# Patient Record
Sex: Female | Born: 1975 | Race: Asian | Hispanic: No | Marital: Married | Smoking: Never smoker
Health system: Southern US, Community
[De-identification: ages and names within clinical notes are randomized; demographics above are authoritative.]

## PROBLEM LIST (undated history)

## (undated) DIAGNOSIS — R519 Headache, unspecified: Secondary | ICD-10-CM

## (undated) DIAGNOSIS — D259 Leiomyoma of uterus, unspecified: Secondary | ICD-10-CM

## (undated) DIAGNOSIS — R921 Mammographic calcification found on diagnostic imaging of breast: Secondary | ICD-10-CM

## (undated) DIAGNOSIS — R51 Headache: Secondary | ICD-10-CM

## (undated) DIAGNOSIS — H9201 Otalgia, right ear: Secondary | ICD-10-CM

## (undated) DIAGNOSIS — N809 Endometriosis, unspecified: Secondary | ICD-10-CM

## (undated) HISTORY — DX: Otalgia, right ear: H92.01

## (undated) HISTORY — PX: HERNIA REPAIR: SHX51

---

## 2011-08-19 HISTORY — PX: BREAST SURGERY: SHX581

## 2016-02-24 ENCOUNTER — Emergency Department
Admission: EM | Admit: 2016-02-24 | Discharge: 2016-02-24 | Disposition: A | Discharge status: Home / Self Care | Payer: BC Managed Care – PPO | Source: Home / Self Care | Attending: Family Medicine | Admitting: Family Medicine

## 2016-02-24 ENCOUNTER — Encounter: Payer: Self-pay | Admitting: Emergency Medicine

## 2016-02-24 DIAGNOSIS — J209 Acute bronchitis, unspecified: Secondary | ICD-10-CM | POA: Diagnosis not present

## 2016-02-24 MED ORDER — AZITHROMYCIN 250 MG PO TABS: ORAL_TABLET | ORAL | Status: DC

## 2016-02-24 MED ORDER — GUAIFENESIN-CODEINE 100-10 MG/5ML PO SOLN: ORAL | Status: DC

## 2016-02-24 NOTE — ED Notes (Signed)
Pt c/o cough x 4 days, some mucous but not a lot, feels like she's loosing her voice, unable to sleep @ night, no runny nose, no ear pain.

## 2016-02-24 NOTE — Discharge Instructions (Signed)
Take plain guaifenesin (1200mg extended release tabs such as Mucinex) twice daily, with plenty of water, for cough and congestion.  Get adequate rest.    °Stop all antihistamines for now, and other non-prescription cough/cold preparations. °  °Follow-up with family doctor if not improving about10 days.  °

## 2016-02-24 NOTE — ED Provider Notes (Signed)
CSN: [id number]     Arrival date & time 02/24/16  1135 History   First MD Initiated Contact with Patient 02/24/16 1214     Chief Complaint  Patient presents with  . Cough      HPI Comments: Patient complains of a four day history of non-productive cough without other cold-like symptoms such as sore throat and sinus congestion.  She has had no fevers, chills, and sweats.  She states that she often coughs until she gags.  The history is provided by the patient.    History reviewed. No pertinent past medical history. History reviewed. No pertinent past surgical history. No family history on file. Social History  Substance Use Topics  . Smoking status: Never Smoker   . Smokeless tobacco: None  . Alcohol Use: No   OB History    No data available     Review of Systems No sore throat + cough No pleuritic pain No wheezing No nasal congestion No post-nasal drainage No sinus pain/pressure No itchy/red eyes No earache No hemoptysis No SOB No fever/chills No nausea No vomiting No abdominal pain No diarrhea No urinary symptoms No skin rash + fatigue No myalgias + headache Used OTC meds without relief  Allergies  Aspirin  Home Medications   Prior to Admission medications   Medication Sig Start Date End Date Taking? Authorizing Provider  azithromycin (ZITHROMAX Z-PAK) 250 MG tablet Take 2 tabs today; then begin one tab once daily for 4 more days. 02/24/16   Kandra Nicolas, MD  guaiFENesin-codeine 100-10 MG/5ML syrup Take 5mL by mouth at bedtime as needed for cough 02/24/16   Kandra Nicolas, MD   Meds Ordered and Administered this Visit  Medications - No data to display  BP 96/61 mmHg  Pulse 67  Temp(Src) 98.4 F (36.9 C) (Oral)  Ht 5\' 7"  (1.702 m)  Wt 115 lb (52.164 kg)  BMI 18.01 kg/m2  SpO2 100%  LMP 02/05/2016 No data found.   Physical Exam Nursing notes and Vital Signs reviewed. Appearance:  Patient appears stated age, and in no acute distress Eyes:   Pupils are equal, round, and reactive to light and accomodation.  Extraocular movement is intact.  Conjunctivae are not inflamed  Ears:  Canals normal.  Tympanic membranes normal.  Nose:  Normal turbinates.  No sinus tenderness.   Pharynx:  Normal Neck:  Supple. No adenopathy  Lungs:  Clear to auscultation.  Breath sounds are equal.  Moving air well. Heart:  Regular rate and rhythm without murmurs, rubs, or gallops.  Abdomen:  Nontender without masses or hepatosplenomegaly.  Bowel sounds are present.  No CVA or flank tenderness.  Extremities:  No edema.  Skin:  No rash present.   ED Course  Procedures none  MDM   1. Acute bronchitis, unspecified organism    Suspect atypical organism.  Begin Z-pak. Rx for Robitussin AC for night time cough.   Take plain guaifenesin (1200mg  extended release tabs such as Mucinex) twice daily, with plenty of water, for cough and congestion.  Get adequate rest.   Stop all antihistamines for now, and other non-prescription cough/cold preparations.   Follow-up with family doctor if not improving about10 days.    Kandra Nicolas, MD 03/01/16 [phone]

## 2018-03-03 ENCOUNTER — Other Ambulatory Visit: Payer: Self-pay | Admitting: Obstetrics and Gynecology

## 2018-03-03 DIAGNOSIS — R921 Mammographic calcification found on diagnostic imaging of breast: Secondary | ICD-10-CM

## 2018-03-15 ENCOUNTER — Ambulatory Visit
Admission: RE | Admit: 2018-03-15 | Discharge: 2018-03-15 | Disposition: A | Discharge status: Home / Self Care | Payer: PRIVATE HEALTH INSURANCE | Source: Ambulatory Visit | Attending: Obstetrics and Gynecology | Admitting: Obstetrics and Gynecology

## 2018-03-15 DIAGNOSIS — R921 Mammographic calcification found on diagnostic imaging of breast: Secondary | ICD-10-CM

## 2018-03-23 ENCOUNTER — Other Ambulatory Visit: Payer: Self-pay | Admitting: General Surgery

## 2018-03-23 DIAGNOSIS — R921 Mammographic calcification found on diagnostic imaging of breast: Secondary | ICD-10-CM

## 2018-03-31 ENCOUNTER — Other Ambulatory Visit: Payer: Self-pay

## 2018-03-31 ENCOUNTER — Encounter (HOSPITAL_BASED_OUTPATIENT_CLINIC_OR_DEPARTMENT_OTHER): Payer: Self-pay | Admitting: *Deleted

## 2018-04-06 ENCOUNTER — Ambulatory Visit
Admission: RE | Admit: 2018-04-06 | Discharge: 2018-04-06 | Disposition: A | Discharge status: Home / Self Care | Payer: PRIVATE HEALTH INSURANCE | Source: Ambulatory Visit | Attending: General Surgery | Admitting: General Surgery

## 2018-04-06 DIAGNOSIS — R921 Mammographic calcification found on diagnostic imaging of breast: Secondary | ICD-10-CM

## 2018-04-08 ENCOUNTER — Ambulatory Visit (HOSPITAL_BASED_OUTPATIENT_CLINIC_OR_DEPARTMENT_OTHER)
Admission: RE | Admit: 2018-04-08 | Discharge: 2018-04-08 | Disposition: A | Discharge status: Home / Self Care | Payer: No Typology Code available for payment source | Source: Ambulatory Visit | Attending: General Surgery | Admitting: General Surgery

## 2018-04-08 ENCOUNTER — Ambulatory Visit (HOSPITAL_BASED_OUTPATIENT_CLINIC_OR_DEPARTMENT_OTHER): Payer: No Typology Code available for payment source | Admitting: Anesthesiology

## 2018-04-08 ENCOUNTER — Ambulatory Visit
Admission: RE | Admit: 2018-04-08 | Discharge: 2018-04-08 | Disposition: A | Discharge status: Home / Self Care | Payer: PRIVATE HEALTH INSURANCE | Source: Ambulatory Visit | Attending: General Surgery | Admitting: General Surgery

## 2018-04-08 ENCOUNTER — Encounter (HOSPITAL_BASED_OUTPATIENT_CLINIC_OR_DEPARTMENT_OTHER): Payer: Self-pay | Admitting: Anesthesiology

## 2018-04-08 ENCOUNTER — Other Ambulatory Visit: Payer: Self-pay

## 2018-04-08 ENCOUNTER — Encounter (HOSPITAL_BASED_OUTPATIENT_CLINIC_OR_DEPARTMENT_OTHER)
Admission: RE | Disposition: A | Discharge status: Home / Self Care | Payer: Self-pay | Source: Ambulatory Visit | Attending: General Surgery

## 2018-04-08 DIAGNOSIS — R920 Mammographic microcalcification found on diagnostic imaging of breast: Secondary | ICD-10-CM | POA: Insufficient documentation

## 2018-04-08 DIAGNOSIS — R921 Mammographic calcification found on diagnostic imaging of breast: Secondary | ICD-10-CM

## 2018-04-08 DIAGNOSIS — N6092 Unspecified benign mammary dysplasia of left breast: Secondary | ICD-10-CM | POA: Insufficient documentation

## 2018-04-08 DIAGNOSIS — D242 Benign neoplasm of left breast: Secondary | ICD-10-CM | POA: Insufficient documentation

## 2018-04-08 DIAGNOSIS — N6012 Diffuse cystic mastopathy of left breast: Secondary | ICD-10-CM | POA: Diagnosis not present

## 2018-04-08 HISTORY — PX: RADIOACTIVE SEED GUIDED EXCISIONAL BREAST BIOPSY: SHX6490

## 2018-04-08 HISTORY — DX: Endometriosis, unspecified: N80.9

## 2018-04-08 HISTORY — DX: Headache: R51

## 2018-04-08 HISTORY — DX: Leiomyoma of uterus, unspecified: D25.9

## 2018-04-08 HISTORY — DX: Mammographic calcification found on diagnostic imaging of breast: R92.1

## 2018-04-08 HISTORY — DX: Headache, unspecified: R51.9

## 2018-04-08 SURGERY — RADIOACTIVE SEED GUIDED BREAST BIOPSY
Anesthesia: General | Site: Breast | Laterality: Left

## 2018-04-08 MED ORDER — ONDANSETRON HCL 4 MG/2ML IJ SOLN
INTRAMUSCULAR | Status: DC | PRN
  Administered 2018-04-08: 4 mg via INTRAVENOUS

## 2018-04-08 MED ORDER — LIDOCAINE 2% (20 MG/ML) 5 ML SYRINGE
INTRAMUSCULAR | Status: AC
  Filled 2018-04-08: qty 5

## 2018-04-08 MED ORDER — ONDANSETRON HCL 4 MG/2ML IJ SOLN
INTRAMUSCULAR | Status: AC
  Filled 2018-04-08: qty 2

## 2018-04-08 MED ORDER — MIDAZOLAM HCL 2 MG/2ML IJ SOLN: 1.0000 mg | INTRAMUSCULAR | Status: DC | PRN

## 2018-04-08 MED ORDER — ACETAMINOPHEN 500 MG PO TABS
1000.0000 mg | ORAL_TABLET | ORAL | Status: AC
  Administered 2018-04-08: 1000 mg via ORAL

## 2018-04-08 MED ORDER — MIDAZOLAM HCL 5 MG/5ML IJ SOLN
INTRAMUSCULAR | Status: DC | PRN
  Administered 2018-04-08: 2 mg via INTRAVENOUS

## 2018-04-08 MED ORDER — MIDAZOLAM HCL 2 MG/2ML IJ SOLN
INTRAMUSCULAR | Status: AC
  Filled 2018-04-08: qty 2

## 2018-04-08 MED ORDER — GLYCOPYRROLATE PF 0.2 MG/ML IJ SOSY
PREFILLED_SYRINGE | INTRAMUSCULAR | Status: AC
  Filled 2018-04-08: qty 1

## 2018-04-08 MED ORDER — LIDOCAINE HCL (CARDIAC) PF 100 MG/5ML IV SOSY
PREFILLED_SYRINGE | INTRAVENOUS | Status: DC | PRN
  Administered 2018-04-08: 30 mg via INTRAVENOUS

## 2018-04-08 MED ORDER — MEPERIDINE HCL 25 MG/ML IJ SOLN: 6.2500 mg | INTRAMUSCULAR | Status: DC | PRN

## 2018-04-08 MED ORDER — FENTANYL CITRATE (PF) 100 MCG/2ML IJ SOLN
INTRAMUSCULAR | Status: DC | PRN
  Administered 2018-04-08: 100 ug via INTRAVENOUS

## 2018-04-08 MED ORDER — FENTANYL CITRATE (PF) 100 MCG/2ML IJ SOLN
INTRAMUSCULAR | Status: AC
  Filled 2018-04-08: qty 2

## 2018-04-08 MED ORDER — DEXAMETHASONE SODIUM PHOSPHATE 10 MG/ML IJ SOLN
INTRAMUSCULAR | Status: AC
  Filled 2018-04-08: qty 1

## 2018-04-08 MED ORDER — HYDROMORPHONE HCL 1 MG/ML IJ SOLN: 0.2500 mg | INTRAMUSCULAR | Status: DC | PRN

## 2018-04-08 MED ORDER — CEFAZOLIN SODIUM-DEXTROSE 2-4 GM/100ML-% IV SOLN
2.0000 g | INTRAVENOUS | Status: AC
  Administered 2018-04-08: 2 g via INTRAVENOUS

## 2018-04-08 MED ORDER — METHYLENE BLUE 0.5 % INJ SOLN
INTRAVENOUS | Status: AC
  Filled 2018-04-08: qty 10

## 2018-04-08 MED ORDER — PROPOFOL 10 MG/ML IV BOLUS
INTRAVENOUS | Status: AC
  Filled 2018-04-08: qty 20

## 2018-04-08 MED ORDER — BUPIVACAINE HCL (PF) 0.25 % IJ SOLN
INTRAMUSCULAR | Status: DC | PRN
  Administered 2018-04-08: 6 mL

## 2018-04-08 MED ORDER — GABAPENTIN 100 MG PO CAPS
100.0000 mg | ORAL_CAPSULE | ORAL | Status: AC
  Administered 2018-04-08: 100 mg via ORAL

## 2018-04-08 MED ORDER — EPHEDRINE SULFATE 50 MG/ML IJ SOLN
INTRAMUSCULAR | Status: DC | PRN
  Administered 2018-04-08 (×3): 10 mg via INTRAVENOUS

## 2018-04-08 MED ORDER — CEFAZOLIN SODIUM-DEXTROSE 2-4 GM/100ML-% IV SOLN
INTRAVENOUS | Status: AC
  Filled 2018-04-08: qty 100

## 2018-04-08 MED ORDER — LACTATED RINGERS IV SOLN: INTRAVENOUS | Status: DC

## 2018-04-08 MED ORDER — OXYCODONE HCL 5 MG/5ML PO SOLN: 5.0000 mg | Freq: Once | ORAL | Status: AC | PRN

## 2018-04-08 MED ORDER — EPHEDRINE 5 MG/ML INJ
INTRAVENOUS | Status: AC
  Filled 2018-04-08: qty 10

## 2018-04-08 MED ORDER — DEXAMETHASONE SODIUM PHOSPHATE 4 MG/ML IJ SOLN
INTRAMUSCULAR | Status: DC | PRN
  Administered 2018-04-08: 10 mg via INTRAVENOUS

## 2018-04-08 MED ORDER — GABAPENTIN 100 MG PO CAPS
ORAL_CAPSULE | ORAL | Status: AC
  Filled 2018-04-08: qty 1

## 2018-04-08 MED ORDER — SODIUM CHLORIDE 0.9 % IJ SOLN
INTRAMUSCULAR | Status: AC
  Filled 2018-04-08: qty 10

## 2018-04-08 MED ORDER — BUPIVACAINE HCL (PF) 0.25 % IJ SOLN
INTRAMUSCULAR | Status: AC
  Filled 2018-04-08: qty 120

## 2018-04-08 MED ORDER — OXYCODONE HCL 5 MG PO TABS
5.0000 mg | ORAL_TABLET | Freq: Once | ORAL | Status: AC | PRN
  Administered 2018-04-08: 5 mg via ORAL

## 2018-04-08 MED ORDER — OXYCODONE HCL 5 MG PO TABS
ORAL_TABLET | ORAL | Status: AC
  Filled 2018-04-08: qty 1

## 2018-04-08 MED ORDER — PROMETHAZINE HCL 25 MG/ML IJ SOLN: 6.2500 mg | INTRAMUSCULAR | Status: DC | PRN

## 2018-04-08 MED ORDER — FENTANYL CITRATE (PF) 100 MCG/2ML IJ SOLN: 50.0000 ug | INTRAMUSCULAR | Status: DC | PRN

## 2018-04-08 MED ORDER — LACTATED RINGERS IV SOLN
INTRAVENOUS | Status: DC
  Administered 2018-04-08 (×2): via INTRAVENOUS

## 2018-04-08 MED ORDER — ACETAMINOPHEN 500 MG PO TABS
ORAL_TABLET | ORAL | Status: AC
  Filled 2018-04-08: qty 2

## 2018-04-08 MED ORDER — GLYCOPYRROLATE 0.2 MG/ML IJ SOLN
INTRAMUSCULAR | Status: DC | PRN
  Administered 2018-04-08 (×2): 0.1 mg via INTRAVENOUS

## 2018-04-08 MED ORDER — SCOPOLAMINE 1 MG/3DAYS TD PT72: 1.0000 | MEDICATED_PATCH | Freq: Once | TRANSDERMAL | Status: DC | PRN

## 2018-04-08 SURGICAL SUPPLY — 42 items
BINDER BREAST MEDIUM (GAUZE/BANDAGES/DRESSINGS) ×2 IMPLANT
BLADE SURG 15 STRL LF DISP TIS (BLADE) ×1 IMPLANT
BLADE SURG 15 STRL SS (BLADE) ×1
CHLORAPREP W/TINT 26ML (MISCELLANEOUS) ×2 IMPLANT
COVER BACK TABLE 60X90IN (DRAPES) ×2 IMPLANT
COVER MAYO STAND STRL (DRAPES) ×2 IMPLANT
COVER PROBE W GEL 5X96 (DRAPES) ×2 IMPLANT
DERMABOND ADVANCED (GAUZE/BANDAGES/DRESSINGS) ×1
DERMABOND ADVANCED .7 DNX12 (GAUZE/BANDAGES/DRESSINGS) ×1 IMPLANT
DEVICE DUBIN W/COMP PLATE 8390 (MISCELLANEOUS) ×2 IMPLANT
DRAPE LAPAROSCOPIC ABDOMINAL (DRAPES) ×2 IMPLANT
DRAPE UTILITY XL STRL (DRAPES) ×2 IMPLANT
ELECT COATED BLADE 2.86 ST (ELECTRODE) ×2 IMPLANT
ELECT REM PT RETURN 9FT ADLT (ELECTROSURGICAL) ×2
ELECTRODE REM PT RTRN 9FT ADLT (ELECTROSURGICAL) ×1 IMPLANT
GLOVE BIO SURGEON STRL SZ7 (GLOVE) ×4 IMPLANT
GLOVE BIOGEL PI IND STRL 7.0 (GLOVE) ×2 IMPLANT
GLOVE BIOGEL PI IND STRL 7.5 (GLOVE) ×1 IMPLANT
GLOVE BIOGEL PI INDICATOR 7.0 (GLOVE) ×2
GLOVE BIOGEL PI INDICATOR 7.5 (GLOVE) ×1
GLOVE ECLIPSE 6.5 STRL STRAW (GLOVE) ×2 IMPLANT
GOWN STRL REUS W/ TWL LRG LVL3 (GOWN DISPOSABLE) ×2 IMPLANT
GOWN STRL REUS W/TWL LRG LVL3 (GOWN DISPOSABLE) ×2
ILLUMINATOR WAVEGUIDE N/F (MISCELLANEOUS) ×2 IMPLANT
KIT MARKER MARGIN INK (KITS) ×2 IMPLANT
NEEDLE HYPO 25X1 1.5 SAFETY (NEEDLE) ×2 IMPLANT
NS IRRIG 1000ML POUR BTL (IV SOLUTION) ×2 IMPLANT
PACK BASIN DAY SURGERY FS (CUSTOM PROCEDURE TRAY) ×2 IMPLANT
PENCIL BUTTON HOLSTER BLD 10FT (ELECTRODE) ×2 IMPLANT
SLEEVE SCD COMPRESS KNEE MED (MISCELLANEOUS) ×2 IMPLANT
SPONGE LAP 4X18 RFD (DISPOSABLE) ×2 IMPLANT
STRIP CLOSURE SKIN 1/2X4 (GAUZE/BANDAGES/DRESSINGS) ×2 IMPLANT
SUT MON AB 5-0 PS2 18 (SUTURE) ×2 IMPLANT
SUT VIC AB 2-0 SH 27 (SUTURE) ×1
SUT VIC AB 2-0 SH 27XBRD (SUTURE) ×1 IMPLANT
SUT VIC AB 3-0 SH 27 (SUTURE) ×1
SUT VIC AB 3-0 SH 27X BRD (SUTURE) ×1 IMPLANT
SYR CONTROL 10ML LL (SYRINGE) ×2 IMPLANT
TOWEL GREEN STERILE FF (TOWEL DISPOSABLE) ×2 IMPLANT
TOWEL OR NON WOVEN STRL DISP B (DISPOSABLE) ×2 IMPLANT
TUBE CONNECTING 20X1/4 (TUBING) ×2 IMPLANT
YANKAUER SUCT BULB TIP NO VENT (SUCTIONS) ×2 IMPLANT

## 2018-04-08 NOTE — H&P (Signed)
  42 yof referred by Dr Theda Sers for left breast calcifications. she is healthy without personal history. this was first mm. no fh of breast or ovarian cancer. she has no mass or dc. she works for [employer] and is here with her husband today. she has d density breasts. she has in uiq of left breast 2.5 cm area of calcs that is not amenable to core biopsy. she was referred to discuss options.  she pronounces her name [place].  Past Surgical History Rolm Bookbinder, MD; 03/24/2018 8:01 AM) Breast Augmentation  Bilateral. Open Inguinal Hernia Surgery  Right.  Diagnostic Studies History Rolm Bookbinder, MD; 03/24/2018 8:01 AM) Colonoscopy  never Mammogram  within last year  Allergies Sabino Gasser; 03/23/2018 8:47 AM) Aspirin *ANALGESICS - NonNarcotic*  Allergies Reconciled   Medication History Sabino Gasser; 03/23/2018 8:47 AM) No Current Medications Medications Reconciled  Social History Rolm Bookbinder, MD; 03/24/2018 8:01 AM) No alcohol use  No caffeine use  No drug use  Tobacco use  Never smoker.  Family History Rolm Bookbinder, MD; 03/24/2018 8:01 AM) First Degree Relatives  No pertinent family history   Pregnancy / Birth History Rolm Bookbinder, MD; 03/24/2018 8:01 AM) Age at menarche  42 years. Gravida  2 Length (months) of breastfeeding  12-24 Maternal age  66-30 Para  2 Regular periods   Vitals Sabino Gasser; 03/23/2018 8:48 AM) 03/23/2018 8:47 AM Weight: 117.25 lb Height: 67in Body Surface Area: 1.61 m Body Mass Index: 18.36 kg/m  Temp.: 98.2F(Oral)  Pulse: 64 (Regular)  BP: 112/72 (Sitting, Left Arm, Standard)   Physical Exam Rolm Bookbinder MD; 03/24/2018 7:56 AM) General Mental Status-Alert. Head and Neck Trachea-midline. Thyroid Gland Characteristics - normal size and consistency. Chest and Lung Exam Chest and lung exam reveals -quiet, even and easy respiratory effort with no use of accessory  muscles. Breast Nipples-No Discharge. Breast Lump-No Palpable Breast Mass. Note: bilateral implants Cardiovascular Cardiovascular examination reveals -normal heart sounds, regular rate and rhythm with no murmurs. Neurologic Neurologic evaluation reveals -alert and oriented x 3 with no impairment of recent or remote memory. Lymphatic Head & Neck General Head & Neck Lymphatics: Bilateral - Description - Normal. Axillary General Axillary Region: Bilateral - Description - Normal. Note: no [place] adenopathy   Assessment & Plan Rolm Bookbinder MD; 03/24/2018 8:00 AM) CALCIFICATION OF LEFT BREAST ON MAMMOGRAPHY (R92.1) Story: Left breast seed guided excisional biopsy we discussed options of six month follow up vs surgery. discussed birads 4 mm. discussed pros and cons of both strategies. she would like to proceed with excisional biopsy and we discussed doing this with seed guidance. discussed surgery, risks and recovery, will proceed soon

## 2018-04-08 NOTE — Discharge Instructions (Signed)
NO TYLENOL before 2:30 pm!    [employer] Phone Number [phone]  POST OP INSTRUCTIONS Take ibuprofen 400 mg every 8 hours or tylenol 650 mg every 6 hours for next 3 days then as needed. Use ice 2-3 times daily for next 3 days also. Always review your discharge instruction sheet given to you by the facility where your surgery was performed.  IF YOU HAVE DISABILITY OR FAMILY LEAVE FORMS, YOU MUST BRING THEM TO THE OFFICE FOR PROCESSING.  DO NOT GIVE THEM TO YOUR DOCTOR.  1. A prescription for pain medication may be given to you upon discharge.  Take your pain medication as prescribed, if needed.  If narcotic pain medicine is not needed, then you may take acetaminophen (Tylenol), naprosyn (Alleve) or ibuprofen (Advil) as needed. 2. Take your usually prescribed medications unless otherwise directed 3. If you need a refill on your pain medication, please contact your pharmacy.  They will contact our office to request authorization.  Prescriptions will not be filled after 5pm or on week-ends. 4. You should eat very light the first 24 hours after surgery, such as soup, crackers, pudding, etc.  Resume your normal diet the day after surgery. 5. Most patients will experience some swelling and bruising in the breast.  Ice packs and a good support bra will help.  Wear the breast binder provided or a sports bra for 72 hours day and night.  After that wear a sports bra during the day until you return to the office. Swelling and bruising can take several days to resolve.  6. It is common to experience some constipation if taking pain medication after surgery.  Increasing fluid intake and taking a stool softener will usually help or prevent this problem from occurring.  A mild laxative (Milk of Magnesia or Miralax) should be taken according to package directions if there are no bowel movements after 48 hours. 7. Unless discharge instructions indicate otherwise, you may remove your  bandages 48 hours after surgery and you may shower at that time.  You may have steri-strips (small skin tapes) in place directly over the incision.  These strips should be left on the skin for 7-10 days and will come off on their own.  If your surgeon used skin glue on the incision, you may shower in 24 hours.  The glue will flake off over the next 2-3 weeks.  Any sutures or staples will be removed at the office during your follow-up visit. 8. ACTIVITIES:  You may resume regular daily activities (gradually increasing) beginning the next day.  Wearing a good support bra or sports bra minimizes pain and swelling.  You may have sexual intercourse when it is comfortable. a. You may drive when you no longer are taking prescription pain medication, you can comfortably wear a seatbelt, and you can safely maneuver your car and apply brakes. b. RETURN TO WORK:  ______________________________________________________________________________________ 9. You should see your doctor in the office for a follow-up appointment approximately two weeks after your surgery.  Your doctors nurse will typically make your follow-up appointment when she calls you with your pathology report.  Expect your pathology report 3-4 business days after your surgery.  You may call to check if you do not hear from [place] after three days. 10. OTHER INSTRUCTIONS: _______________________________________________________________________________________________ _____________________________________________________________________________________________________________________________________ _____________________________________________________________________________________________________________________________________ _____________________________________________________________________________________________________________________________________  WHEN TO CALL DR WAKEFIELD: 1. Fever over 101.0 2. Nausea and/or vomiting. 3. Extreme swelling  or bruising. 4. Continued bleeding from incision. 5. Increased pain, redness, or  drainage from the incision.  The clinic staff is available to answer your questions during regular business hours.  Please dont hesitate to call and ask to speak to one of the nurses for clinical concerns.  If you have a medical emergency, go to the nearest emergency room or call 911.  A surgeon from [facility] is always on call at the hospital.  For further questions, please visit [email or web address]     Post Anesthesia Home Care Instructions  Activity: Get plenty of rest for the remainder of the day. A responsible individual must stay with you for 24 hours following the procedure.  For the next 24 hours, DO NOT: -Drive a car -Paediatric nurse -Drink alcoholic beverages -Take any medication unless instructed by your physician -Make any legal decisions or sign important papers.  Meals: Start with liquid foods such as gelatin or soup. Progress to regular foods as tolerated. Avoid greasy, spicy, heavy foods. If nausea and/or vomiting occur, drink only clear liquids until the nausea and/or vomiting subsides. Call your physician if vomiting continues.  Special Instructions/Symptoms: Your throat may feel dry or sore from the anesthesia or the breathing tube placed in your throat during surgery. If this causes discomfort, gargle with warm salt water. The discomfort should disappear within 24 hours.  If you had a scopolamine patch placed behind your ear for the management of post- operative nausea and/or vomiting:  1. The medication in the patch is effective for 72 hours, after which it should be removed.  Wrap patch in a tissue and discard in the trash. Wash hands thoroughly with soap and water. 2. You may remove the patch earlier than 72 hours if you experience unpleasant side effects which may include dry mouth, dizziness or visual disturbances. 3. Avoid touching the patch. Wash  your hands with soap and water after contact with the patch.

## 2018-04-08 NOTE — Op Note (Signed)
Preoperative diagnosis: Left breast microcalcifications, unable to core biopsy due to breast size and presence of implants Postoperative diagnosis: Same as above Procedure: Left breast seed guided excisional biopsy Surgeon: Dr. Serita Grammes Anesthesia: General Specimens: Left breast tissue marked with paint Complications: None Drains: None Sponge needle count was correct at completion Disposition: To recovery in stable condition  Indications: This is a 42 year old healthy female who presents with left breast microcalcifications.  She is unable to undergo a core biopsy.  She was recommended for excision.  I discussed options with her and we have decided to proceed with a seed guided excisional biopsy  Procedure: After informed consent was obtained the patient was taken to the operating room.  She was placed under general anesthesia with comp without complication.  She had been given antibiotics.  SCDs were in place.  She was prepped and draped in the standard sterile surgical fashion.  A surgical timeout was then performed.  I located the seed in the upper outer quadrant.  I then infiltrated Marcaine throughout this area.  I made a periareolar incision in order to hide the scar.  I then proceeded to use the neoprobe to guide the excision of the seed and the surrounding tissue.  This was then marked with paint and passed off the table.  Mammography confirmed removal of the seed and multiple calcifications.  Hemostasis was then obtained.  I closed the breast tissue with 2-0 Vicryl.  The skin was closed with 3-0 Vicryl, 5-0 Monocryl, and glue.  She tolerated this well was extubated and transferred to recovery room stable.

## 2018-04-08 NOTE — Interval H&P Note (Signed)
History and Physical Interval Note:  04/08/2018 8:38 AM  Deborah Franklin  has presented today for surgery, with the diagnosis of LEFT BREAST CALCIFICATIONS  The various methods of treatment have been discussed with the patient and family. After consideration of risks, benefits and other options for treatment, the patient has consented to  Procedure(s): RADIOACTIVE SEED GUIDED EXCISIONAL BREAST BIOPSY (Left) as a surgical intervention .  The patient's history has been reviewed, patient examined, no change in status, stable for surgery.  I have reviewed the patient's chart and labs.  Questions were answered to the patient's satisfaction.     Rolm Bookbinder

## 2018-04-08 NOTE — Anesthesia Procedure Notes (Signed)
Procedure Name: LMA Insertion Date/Time: 04/08/2018 9:22 AM Performed by: Marrianne Mood, CRNA Pre-anesthesia Checklist: Patient identified, Emergency Drugs available, Suction available, Patient being monitored and Timeout performed Patient Re-evaluated:Patient Re-evaluated prior to induction Oxygen Delivery Method: Circle system utilized Preoxygenation: Pre-oxygenation with 100% oxygen Induction Type: IV induction Ventilation: Mask ventilation without difficulty LMA: LMA inserted LMA Size: 4.0 Number of attempts: 1 Airway Equipment and Method: Bite block Placement Confirmation: positive ETCO2 Tube secured with: Tape Dental Injury: Teeth and Oropharynx as per pre-operative assessment

## 2018-04-08 NOTE — Anesthesia Postprocedure Evaluation (Signed)
Anesthesia Post Note  Patient: Deborah Franklin  Procedure(s) Performed: RADIOACTIVE SEED GUIDED EXCISIONAL BREAST BIOPSY (Left Breast)     Patient location during evaluation: PACU Anesthesia Type: General Level of consciousness: awake and alert Pain management: pain level controlled Vital Signs Assessment: post-procedure vital signs reviewed and stable Respiratory status: spontaneous breathing, nonlabored ventilation, respiratory function stable and patient connected to nasal cannula oxygen Cardiovascular status: blood pressure returned to baseline and stable Postop Assessment: no apparent nausea or vomiting Anesthetic complications: no    Last Vitals:  Vitals:   04/08/18 1100 04/08/18 1115  BP: 135/90 131/87  Pulse: 71 62  Resp: 17 12  Temp:    SpO2: 100% 100%    Last Pain:  Vitals:   04/08/18 1115  TempSrc:   PainSc: Talmage Cederick Broadnax

## 2018-04-08 NOTE — Anesthesia Preprocedure Evaluation (Addendum)
Anesthesia Evaluation  Patient identified by MRN, date of birth, ID band Patient awake    Reviewed: Allergy & Precautions, NPO status , Patient's Chart, lab work & pertinent test results  Airway Mallampati: II  TM Distance: >3 FB Neck ROM: Full    Dental  (+) Teeth Intact, Dental Advisory Given   Pulmonary neg pulmonary ROS,    breath sounds clear to auscultation       Cardiovascular negative cardio ROS   Rhythm:Regular Rate:Normal     Neuro/Psych  Headaches, negative psych ROS   GI/Hepatic negative GI ROS, Neg liver ROS,   Endo/Other  negative endocrine ROS  Renal/GU negative Renal ROS     Musculoskeletal negative musculoskeletal ROS (+)   Abdominal   Peds  Hematology negative hematology ROS (+)   Anesthesia Other Findings   Reproductive/Obstetrics                            Anesthesia Physical Anesthesia Plan  ASA: II  Anesthesia Plan: General   Post-op Pain Management:    Induction: Intravenous  PONV Risk Score and Plan: 4 or greater and Ondansetron, Dexamethasone, Midazolam and Scopolamine patch - Pre-op  Airway Management Planned: LMA  Additional Equipment: None  Intra-op Plan:   Post-operative Plan: Extubation in OR  Informed Consent: I have reviewed the patients History and Physical, chart, labs and discussed the procedure including the risks, benefits and alternatives for the proposed anesthesia with the patient or authorized representative who has indicated his/her understanding and acceptance.   Dental advisory given  Plan Discussed with: CRNA  Anesthesia Plan Comments:        Anesthesia Quick Evaluation

## 2018-04-08 NOTE — Transfer of Care (Signed)
Immediate Anesthesia Transfer of Care Note  Patient: Deborah Franklin  Procedure(s) Performed: RADIOACTIVE SEED GUIDED EXCISIONAL BREAST BIOPSY (Left Breast)  Patient Location: PACU  Anesthesia Type:General  Level of Consciousness: awake and patient cooperative  Airway & Oxygen Therapy: Patient Spontanous Breathing and Patient connected to face mask oxygen  Post-op Assessment: Report given to RN and Post -op Vital signs reviewed and stable  Post vital signs: Reviewed and stable  Last Vitals:  Vitals Value Taken Time  BP    Temp    Pulse 72 04/08/2018 10:06 AM  Resp    SpO2 100 % 04/08/2018 10:06 AM  Vitals shown include unvalidated device data.  Last Pain:  Vitals:   04/08/18 0833  TempSrc: Oral  PainSc: 1       Patients Stated Pain Goal: 1 (37/44/51 4604)  Complications: No apparent anesthesia complications

## 2018-04-09 ENCOUNTER — Encounter (HOSPITAL_BASED_OUTPATIENT_CLINIC_OR_DEPARTMENT_OTHER): Payer: Self-pay | Admitting: General Surgery

## 2019-10-20 ENCOUNTER — Ambulatory Visit: Payer: No Typology Code available for payment source | Attending: Family

## 2019-10-20 DIAGNOSIS — Z23 Encounter for immunization: Secondary | ICD-10-CM | POA: Insufficient documentation

## 2019-10-20 NOTE — Progress Notes (Signed)
   Covid-19 Vaccination Clinic  Name:  Deborah Franklin    MRN: [id number] DOB: 07-07-1976  10/20/2019  Deborah Franklin was observed post Covid-19 immunization for 15 minutes without incident. She was provided with Vaccine Information Sheet and instruction to access the V-Safe system.   Deborah Franklin was instructed to call 911 with any severe reactions post vaccine: Marland Kitchen Difficulty breathing  . Swelling of face and throat  . A fast heartbeat  . A bad rash all over body  . Dizziness and weakness   Immunizations Administered    Name Date Dose VIS Date Route   Moderna COVID-19 Vaccine 10/20/2019  2:26 PM 0.5 mL 07/19/2019 Intramuscular   Manufacturer: Moderna   Lot: [id number]   [id number]

## 2019-11-22 ENCOUNTER — Ambulatory Visit: Payer: No Typology Code available for payment source | Attending: Internal Medicine

## 2019-11-22 DIAGNOSIS — Z23 Encounter for immunization: Secondary | ICD-10-CM

## 2019-11-22 NOTE — Progress Notes (Signed)
   Covid-19 Vaccination Clinic  Name:  Deborah Franklin    MRN: [id number] DOB: 12-24-1975  11/22/2019  [name] was observed post Covid-19 immunization for 15 minutes without incident. She was provided with Vaccine Information Sheet and instruction to access the V-Safe system.   [name] was instructed to call 911 with any severe reactions post vaccine: Marland Kitchen Difficulty breathing  . Swelling of face and throat  . A fast heartbeat  . A bad rash all over body  . Dizziness and weakness   Immunizations Administered    Name Date Dose VIS Date Route   Moderna COVID-19 Vaccine 11/22/2019  1:02 PM 0.5 mL 07/19/2019 Intramuscular   Manufacturer: Moderna   Lot: [id number]   [id number]

## 2020-03-18 IMAGING — MG BREAST SURGICAL SPECIMEN
1 series · 1 of 1 positions shown · non-contrast
Comparison: Previous exam(s).

CLINICAL DATA: Radioactive seed localization was performed on
April 06, 2018 of a region of calcifications in the upper inner
left breast prior to excisional biopsy. The patient had her
excisional biopsy today.

EXAM:
SPECIMEN RADIOGRAPH OF THE LEFT BREAST

[L]
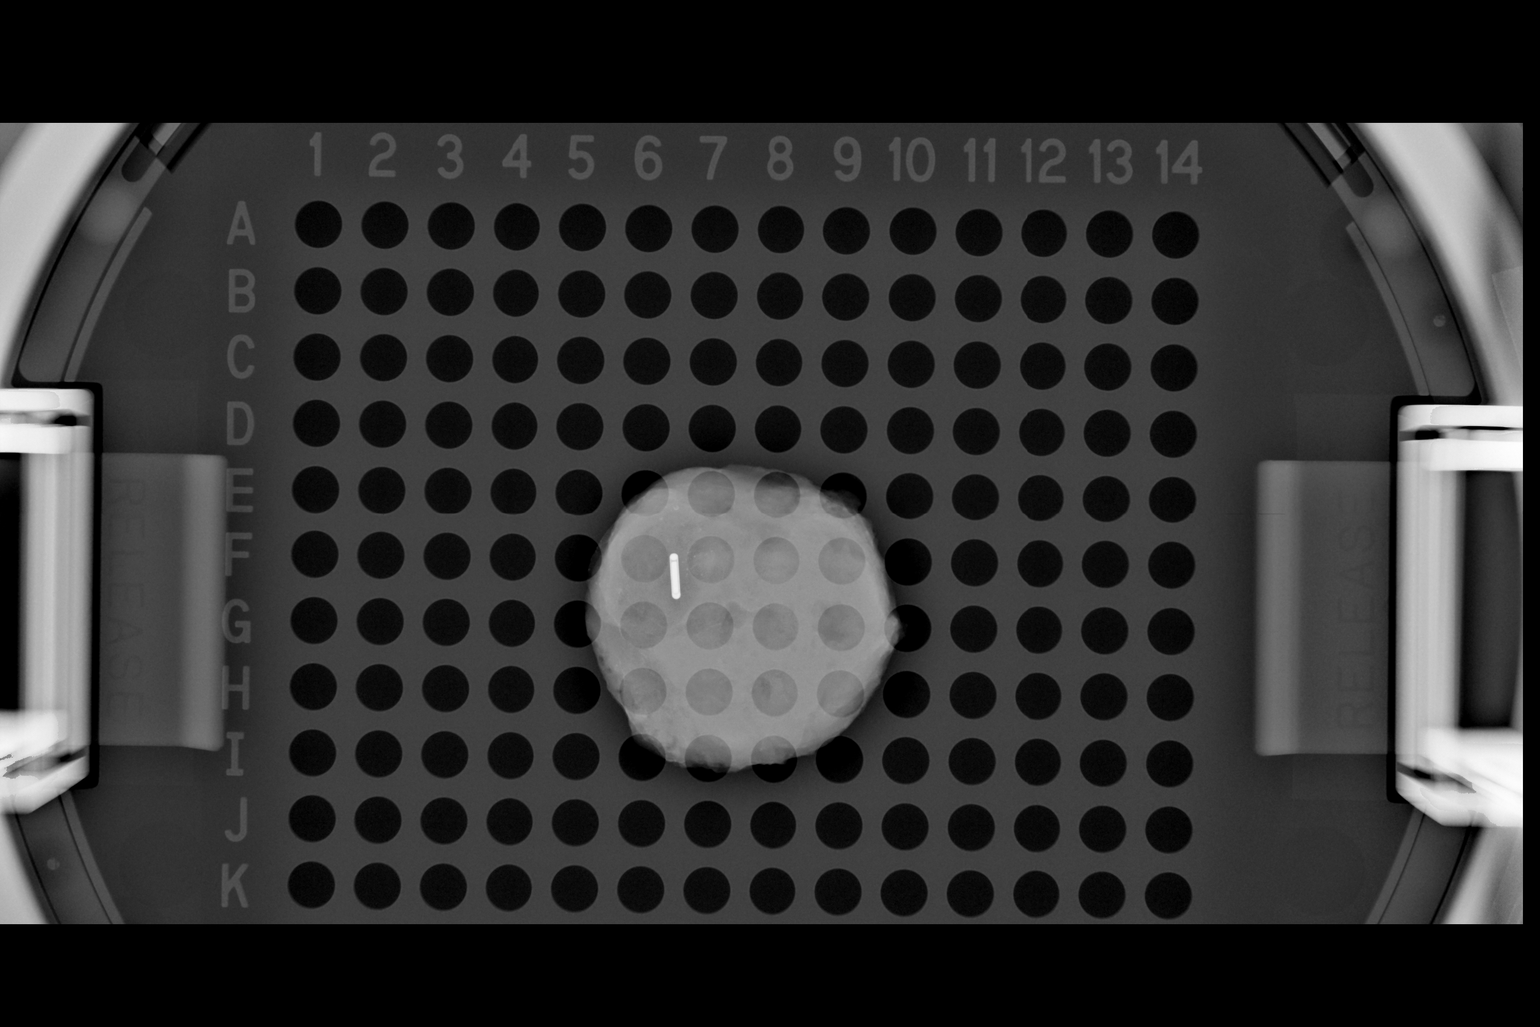

[1 of 1 positions shown; findings below may reference images not displayed]

FINDINGS: Status post excision of the left breast. The radioactive seed and
multiple calcifications are present, completely intact, and were
marked for pathology.
IMPRESSION: Specimen radiograph of the left breast.

## 2022-05-13 ENCOUNTER — Encounter: Payer: Self-pay | Admitting: *Deleted

## 2022-05-14 ENCOUNTER — Ambulatory Visit: Payer: No Typology Code available for payment source | Admitting: Psychiatry

## 2022-05-14 VITALS — BP 107/68 | HR 75 | Ht 67.0 in | Wt 119.2 lb

## 2022-05-14 DIAGNOSIS — M5481 Occipital neuralgia: Secondary | ICD-10-CM | POA: Diagnosis not present

## 2022-05-14 NOTE — Progress Notes (Signed)
Referring:  Stamey, Girtha Rm, [place],  [place] [address]  PCP: Orpah Melter, MD  Neurology was asked to evaluate Deborah Franklin, a 46 year old female for a chief complaint of headaches.  Our recommendations of care will be communicated by shared medical record.    CC:  headaches  History provided from self  HPI:  Medical co-morbidities: migraines  The patient presents for evaluation of headaches which began in last year. This has occurred 3-4 times in the past year. Headaches are described as sharp, shocking pain behind her ear which radiates up her scalp. It can occur on either side. Headaches are not associated with photophobia, phonophobia, or nausea. Pain will last for 2-3 seconds at a time, but can reoccur multiple times per day. She also had tenderness on the back of her head. Her most recent headache was in July. Headaches lasted for 3-4 weeks, then resolved.  Her PCP prescribed prednisone, which helped slightly but did not fully relieve the pain. She was also prescribed Lyrica 50 mg BID but did not take it as her pain resolved.  She also reports intermittent numbness in her left pinky. This typically lasts ~20 minutes at a time. Denies positional aspect. No radicular pain or upper extremity weakness.  Headache History: Onset: August 2023 Triggers: none Aura: none Location: behind the ear up the scalp Quality/Description: electrical shock Associated Symptoms:  Photophobia: none  Phonophobia: none  Nausea: none Worse with activity?: no Duration of headaches: 2-3 seconds, multiple times per day  Headache days per month: 0 Headache free days per month: 30  Current Treatment: Abortive none  Preventative none  Prior Therapies                                 Imitrex 25 mg PRN Flexeril 10 mg PRN prednisone  LABS: 08/2019 CBC, CMP wnl  IMAGING:  none  Current Outpatient Medications on File Prior to Visit  Medication Sig Dispense Refill    Mefenamic Acid 250 MG CAPS Take by mouth.     SUMAtriptan (IMITREX) 100 MG tablet Take 100 mg by mouth every 2 (two) hours as needed for migraine. May repeat in 2 hours if headache persists or recurs.     No current facility-administered medications on file prior to visit.     Allergies: Allergies  Allergen Reactions   Aspirin Other (See Comments)    GI upset   Caffeine Other (See Comments)    Gi upset     Family History: Migraine or other headaches in the family:  mother, son Aneurysms in a first degree relative:  no Brain tumors in the family:  no Other neurological illness in the family:   no  Past Medical History: Past Medical History:  Diagnosis Date   Breast calcifications    left   Ear pain, right    Endometriosis    Headache    menstrual migraines   Uterine fibroid     Past Surgical History Past Surgical History:  Procedure Laterality Date   BREAST SURGERY Bilateral 2013   enhancement    HERNIA REPAIR Right    IHR   RADIOACTIVE SEED GUIDED EXCISIONAL BREAST BIOPSY Left 04/08/2018   Procedure: RADIOACTIVE SEED GUIDED EXCISIONAL BREAST BIOPSY;  Surgeon: Rolm Bookbinder, MD;  Location: [facility];  Service: General;  Laterality: Left;    Social History: Social History   Tobacco  Use   Smoking status: Never   Smokeless tobacco: Never  Substance Use Topics   Alcohol use: No   Drug use: Never    ROS: Negative for fevers, chills. Positive for headaches. All other systems reviewed and negative unless stated otherwise in HPI.   Physical Exam:   Vital Signs: BP 107/68   Pulse 75   Ht '5\' 7"'$  (1.702 m)   Wt 119 lb 4 oz (54.1 kg)   BMI 18.68 kg/m  GENERAL: well appearing,in no acute distress,alert SKIN:  Color, texture, turgor normal. No rashes or lesions HEAD:  Normocephalic/atraumatic. CV:  RRR RESP: Normal respiratory effort MSK: +tenderness to palpation over bilateral occiput  NEUROLOGICAL: Mental Status: Alert, oriented to  person, place and time,Follows commands Cranial Nerves: PERRL, visual fields intact to confrontation, extraocular movements intact, facial sensation intact, no facial droop or ptosis, hearing grossly intact, no dysarthria Motor: muscle strength 5/5 both upper and lower extremities Reflexes: 2+ throughout Sensation: intact to light touch all 4 extremities Coordination: Finger-to- nose-finger intact bilaterally Gait: normal-based   IMPRESSION: 46 year old female with a history of migraines who presents for evaluation of occipital headaches. Her exam reveals tenderness over bilateral occipital notches, suggestive of occipital neuralgia. She would prefer not to start a medication if possible. Referral to neck PT placed for cervicalgia/occipital neuralgia. Offered EMG for intermittent pinky numbness, which she declined at this time. She will let me know if this becomes more frequent.  PLAN: -Referral to neck PT for occipital neuralgia -Next steps: consider EMG for pinky numbness, consider PRN muscle relaxer or gabapentin if headaches return   I spent a total of 31 minutes chart reviewing and counseling the patient. Headache education was done. Discussed treatment options including preventive and acute medications, and physical therapy. Written educational materials and patient instructions outlining all of the above were given.  Follow-up: 7 months   Genia Harold, MD 05/14/2022   3:25 PM

## 2022-05-14 NOTE — Patient Instructions (Signed)
Referral to physical therapy for the neck  Occipital neuralgia Your headaches are consistent with occipital neuralgia.This is caused by irritation of the occipital nerve, which travels between your neck muscles and provides sensation to your scalp. Pain is often described as a sharp, shooting pain at the back of the head which travels to the scalp and behind the eye. Other symptoms include tenderness at the base of the head, neck pain, and a sensation of numbness, tingling, or crawling on the scalp. It is often associated with neck tension. When neck muscles are tight they can compress the nerve and cause irritation.   Treatment options include physical therapy for neck pain and tightness. We can also use medications for neuropathic pain (pain originating from a nerve) as well as muscle relaxers. We can also perform an occipital nerve block, which is an injection of steroids and a numbing medication in the back of the head. This can be performed every 3 months if needed.

## 2022-05-21 ENCOUNTER — Other Ambulatory Visit: Payer: Self-pay

## 2022-05-21 ENCOUNTER — Ambulatory Visit: Payer: No Typology Code available for payment source | Attending: Psychiatry | Admitting: Physical Therapy

## 2022-05-21 ENCOUNTER — Encounter: Payer: Self-pay | Admitting: Physical Therapy

## 2022-05-21 DIAGNOSIS — M542 Cervicalgia: Secondary | ICD-10-CM | POA: Insufficient documentation

## 2022-05-21 DIAGNOSIS — M5481 Occipital neuralgia: Secondary | ICD-10-CM | POA: Diagnosis not present

## 2022-05-21 DIAGNOSIS — R293 Abnormal posture: Secondary | ICD-10-CM | POA: Insufficient documentation

## 2022-05-21 DIAGNOSIS — M5411 Radiculopathy, occipito-atlanto-axial region: Secondary | ICD-10-CM | POA: Insufficient documentation

## 2022-05-21 NOTE — Therapy (Unsigned)
OUTPATIENT PHYSICAL THERAPY CERVICAL EVALUATION   Patient Name: Deborah Franklin MRN: [id number] DOB:01-26-1976, 46 y.o., female Today's Date: 05/21/2022   PT End of Session - 05/21/22 1321     Visit Number 1    Number of Visits 6    Date for PT Re-Evaluation 07/02/22    PT Start Time 4166    PT Stop Time 1400    PT Time Calculation (min) 39 min    Activity Tolerance Patient tolerated treatment well    Behavior During Therapy WFL for tasks assessed/performed             Past Medical History:  Diagnosis Date   Breast calcifications    left   Ear pain, right    Endometriosis    Headache    menstrual migraines   Uterine fibroid    Past Surgical History:  Procedure Laterality Date   BREAST SURGERY Bilateral 2013   enhancement    HERNIA REPAIR Right    IHR   RADIOACTIVE SEED GUIDED EXCISIONAL BREAST BIOPSY Left 04/08/2018   Procedure: RADIOACTIVE SEED GUIDED EXCISIONAL BREAST BIOPSY;  Surgeon: Rolm Bookbinder, MD;  Location: [facility];  Service: General;  Laterality: Left;   There are no problems to display for this patient.   PCP: Orpah Melter  REFERRING PROVIDER: Genia Harold  REFERRING DIAG: Occipital neuralgia  THERAPY DIAG:  Abnormal posture  Cervicalgia  Radiculopathy, occipito-atlanto-axial region  Rationale for Evaluation and Treatment Rehabilitation  ONSET DATE: This past year  SUBJECTIVE:                                                                                                                                                                                                         SUBJECTIVE STATEMENT: Pt reports nerve problem. Episodes have been increasing and symptoms last months. Pt notes 3 episodes this past year. Not sure what is the root cause of her episodes. Last episode noted to be in July until end of September. Pt reports feeling it in the back in the ear to the top of the head. More sleep and rest has not helped.  No history of injuries. Does note a history of migraines -- husband massaging neck does seem to help but primarily uses medication to control it. Pt does report using cupping at home to ease neck/shoulder pain.  PERTINENT HISTORY:  Long history of migraines  PAIN:  Are you having pain? Yes: NPRS scale: 10 at worst when it occurs/10 Pain location: Behind ear to head Pain description: Sharp/shocking pain that goes away Aggravating factors:  not known Relieving factors: not known  PRECAUTIONS: None  WEIGHT BEARING RESTRICTIONS No  FALLS:  Has patient fallen in last 6 months? No  LIVING ENVIRONMENT: Lives with: lives with their family Lives in: House/apartment Stairs: No Has following equipment at home: None  OCCUPATION: Professor; mostly at desk  PLOF: Independent  PATIENT GOALS Trial of PT for conservative measures  OBJECTIVE:   DIAGNOSTIC FINDINGS:  N/a  PATIENT SURVEYS:  FOTO n/a -- no issues currently   COGNITION: Overall cognitive status: Within functional limits for tasks assessed   SENSATION: WFL  POSTURE: rounded shoulders and forward head  PALPATION: TTP with multiple trigger points in suboccipitals, cervical to thoracic paraspinals, posterior scalenes, levators, UTs   CERVICAL ROM:   Active ROM A/PROM (deg) eval  Flexion 38  Extension 65  Right lateral flexion 38  Left lateral flexion 38  Right rotation 80  Left rotation 80   (Blank rows = not tested)  UPPER EXTREMITY ROM: WFL  UPPER EXTREMITY MMT:  MMT Right eval Left eval  Shoulder flexion 5 5  Shoulder extension 4+ 4+  Shoulder abduction 5 5  Shoulder adduction    Shoulder extension 4- 4-  Shoulder internal rotation 4- 4-  Shoulder external rotation 4- 4-  Middle trapezius    Lower trapezius    Elbow flexion    Elbow extension    Wrist flexion    Wrist extension    Wrist ulnar deviation    Wrist radial deviation    Wrist pronation    Wrist supination    Grip strength      (Blank rows = not tested)  CERVICAL SPECIAL TESTS:  Did not assess   TODAY'S TREATMENT:  Access Code: [email or web address] URL: [email or web address] Date: 05/21/2022 Prepared by: Estill Bamberg April Thurnell Garbe  Exercises - Standing Cervical Retraction  - 1 x daily - 7 x weekly - 2 sets - 10 reps - 3 sec hold - Seated Scapular Retraction  - 1 x daily - 7 x weekly - 2 sets - 10 reps - 3 sec hold - Seated Levator Scapulae Stretch  - 1 x daily - 7 x weekly - 30 sec hold - Seated Gentle Upper Trapezius Stretch  - 1 x daily - 7 x weekly - 30 sec hold - Doorway Pec Stretch at 60 Elevation  - 1 x daily - 7 x weekly - 3 sets - 10 reps - Doorway Pec Stretch at 90 Degrees Abduction  - 1 x daily - 7 x weekly - 3 sets - 10 reps - Doorway Pec Stretch at 120 Degrees Abduction  - 1 x daily - 7 x weekly - 3 sets - 10 reps   PATIENT EDUCATION:  Education details: Exam findings, POC, HEP. Discussed theracane.  Person educated: Patient Education method: Explanation, Demonstration, and Handouts Education comprehension: verbalized understanding, returned demonstration, and needs further education   HOME EXERCISE PROGRAM: Access Code: [email or web address] URL: [email or web address] Date: 05/21/2022 Prepared by: Estill Bamberg April Thurnell Garbe  Exercises - Standing Cervical Retraction  - 1 x daily - 7 x weekly - 2 sets - 10 reps - 3 sec hold - Seated Scapular Retraction  - 1 x daily - 7 x weekly - 2 sets - 10 reps - 3 sec hold - Seated Levator Scapulae Stretch  - 1 x daily - 7 x weekly - 30 sec hold - Seated Gentle Upper Trapezius Stretch  - 1 x daily - 7 x weekly - 30 sec hold - Doorway Pec Stretch at  60 Elevation  - 1 x daily - 7 x weekly - 3 sets - 10 reps - Doorway Pec Stretch at 90 Degrees Abduction  - 1 x daily - 7 x weekly - 3 sets - 10 reps - Doorway Pec Stretch at 120 Degrees Abduction  - 1 x daily - 7 x weekly - 3 sets - 10 reps  ASSESSMENT:  CLINICAL IMPRESSION: Patient is a 46 y.o. F  who was seen today for physical therapy evaluation and treatment for occipital neuralgia with chronic neck tightness. ***   OBJECTIVE IMPAIRMENTS decreased strength, increased fascial restrictions, increased muscle spasms, impaired UE functional use, improper body mechanics, and postural dysfunction.   ACTIVITY LIMITATIONS sitting  PARTICIPATION LIMITATIONS: occupation  PERSONAL FACTORS Fitness, Past/current experiences, Profession, and Time since onset of injury/illness/exacerbation are also affecting patient's functional outcome.   REHAB POTENTIAL: Good  CLINICAL DECISION MAKING: Evolving/moderate complexity  EVALUATION COMPLEXITY: Moderate   GOALS: Goals reviewed with patient? Yes   LONG TERM GOALS: Target date: 07/02/2022  *** Baseline: *** Goal status: {GOALSTATUS:25110}  2.  *** Baseline: *** Goal status: {GOALSTATUS:25110}  3.  *** Baseline: *** Goal status: {GOALSTATUS:25110}  4.  *** Baseline: *** Goal status: {GOALSTATUS:25110}  5.  *** Baseline: *** Goal status: {GOALSTATUS:25110}  6.  *** Baseline: *** Goal status: {GOALSTATUS:25110}   PLAN: PT FREQUENCY: {rehab frequency:25116}  PT DURATION: {rehab duration:25117}  PLANNED INTERVENTIONS: {rehab planned interventions:25118::"Therapeutic exercises","Therapeutic activity","Neuromuscular re-education","Balance training","Gait training","Patient/Family education","Self Care","Joint mobilization"}  PLAN FOR NEXT SESSION: ***   Chiara Coltrin April Ma L Magaline Steinberg, PT 05/21/2022, 4:31 PM

## 2022-05-28 ENCOUNTER — Ambulatory Visit: Payer: No Typology Code available for payment source | Admitting: Physical Therapy

## 2022-06-05 ENCOUNTER — Ambulatory Visit: Payer: No Typology Code available for payment source | Admitting: Physical Therapy

## 2022-06-05 ENCOUNTER — Encounter: Payer: Self-pay | Admitting: Physical Therapy

## 2022-06-05 DIAGNOSIS — R293 Abnormal posture: Secondary | ICD-10-CM

## 2022-06-05 DIAGNOSIS — M542 Cervicalgia: Secondary | ICD-10-CM

## 2022-06-05 DIAGNOSIS — M5481 Occipital neuralgia: Secondary | ICD-10-CM | POA: Diagnosis not present

## 2022-06-05 DIAGNOSIS — M5411 Radiculopathy, occipito-atlanto-axial region: Secondary | ICD-10-CM

## 2022-06-05 NOTE — Therapy (Signed)
OUTPATIENT PHYSICAL THERAPY TREATMENT   Patient Name: Deborah Franklin MRN: [id number] DOB:1975/10/30, 46 y.o., female Today's Date: 06/05/2022   PT End of Session - 06/05/22 1618     Visit Number 2    Number of Visits 6    Date for PT Re-Evaluation 07/02/22    PT Start Time 1618    PT Stop Time 1700    PT Time Calculation (min) 42 min    Activity Tolerance Patient tolerated treatment well    Behavior During Therapy [facility] for tasks assessed/performed             Past Medical History:  Diagnosis Date   Breast calcifications    left   Ear pain, right    Endometriosis    Headache    menstrual migraines   Uterine fibroid    Past Surgical History:  Procedure Laterality Date   BREAST SURGERY Bilateral 2013   enhancement    HERNIA REPAIR Right    IHR   RADIOACTIVE SEED GUIDED EXCISIONAL BREAST BIOPSY Left 04/08/2018   Procedure: RADIOACTIVE SEED GUIDED EXCISIONAL BREAST BIOPSY;  Surgeon: Rolm Bookbinder, MD;  Location: [facility];  Service: General;  Laterality: Left;   There are no problems to display for this patient.   PCP: Orpah Melter  REFERRING PROVIDER: Genia Harold  REFERRING DIAG: Occipital neuralgia  THERAPY DIAG:  Abnormal posture  Cervicalgia  Radiculopathy, occipito-atlanto-axial region  Rationale for Evaluation and Treatment Rehabilitation  ONSET DATE: This past year  SUBJECTIVE:                                                                                                                                                                                                         SUBJECTIVE STATEMENT: 10/19: Pt states she's been doing the exercises. Reports that when she does some of the stretches she can slightly feel a migraine coming on.   PERTINENT HISTORY:  Long history of migraines  PAIN:  Are you having pain? Yes: NPRS scale: 10 at worst when it occurs/10 Pain location: Behind ear to head Pain description:  Sharp/shocking pain that goes away Aggravating factors: not known Relieving factors: not known  PRECAUTIONS: None  WEIGHT BEARING RESTRICTIONS No  FALLS:  Has patient fallen in last 6 months? No  LIVING ENVIRONMENT: Lives with: lives with their family Lives in: House/apartment Stairs: No Has following equipment at home: None  OCCUPATION: Professor; mostly at desk  PLOF: Independent  PATIENT GOALS Trial of PT for conservative measures to address her occipital neuralgia  OBJECTIVE:   DIAGNOSTIC FINDINGS:  N/a  PATIENT SURVEYS:  FOTO n/a -- no issues currently   COGNITION: Overall cognitive status: Within functional limits for tasks assessed   SENSATION: WFL  POSTURE: rounded shoulders and forward head  PALPATION: TTP with multiple trigger points in suboccipitals, cervical to thoracic paraspinals, posterior scalenes, levators, UTs   CERVICAL ROM:   Active ROM A/PROM (deg) eval  Flexion 38  Extension 65  Right lateral flexion 38  Left lateral flexion 38  Right rotation 80  Left rotation 80   (Blank rows = not tested)  UPPER EXTREMITY ROM: WFL  UPPER EXTREMITY MMT:  MMT Right eval Left eval  Shoulder flexion 5 5  Shoulder extension 4+ 4+  Shoulder abduction 5 5  Shoulder adduction    Shoulder extension 4- 4-  Shoulder internal rotation 4- 4-  Shoulder external rotation 4- 4-  Middle trapezius    Lower trapezius    Elbow flexion    Elbow extension    Wrist flexion    Wrist extension    Wrist ulnar deviation    Wrist radial deviation    Wrist pronation    Wrist supination    Grip strength     (Blank rows = not tested)  CERVICAL SPECIAL TESTS:  Did not assess   TODAY'S TREATMENT:  06/05/22 THEREX UBE L1 x 4 min Neck flexion x30 sec Upper trap stretch x30 sec R&L Trial of suboccipital stretching in sitting and supine -- pt unable to provide enough force to obtain adequate stretch  MANUAL THERAPY STM & TPR upper trap, thoracic  paraspinals, cervical paraspinals, suboccipitals, posterior scalenes Skilled assessment and palpation for TPDN Trigger Point Dry-Needling  Treatment instructions: Expect mild to moderate muscle soreness. S/S of pneumothorax if dry needled over a lung field, and to seek immediate medical attention should they occur. Patient verbalized understanding of these instructions and education.  Patient Consent Given: Yes Education handout provided: Yes Muscles treated: Suboccipitals, posterior scalenes Electrical stimulation performed: No Parameters: N/A Treatment response/outcome: Palpable increase in muscle length, twitch response illicited  MODALITIES Moist heat x8 min   PATIENT EDUCATION:  Education details: Exam findings, POC, HEP. Discussed theracane.  Person educated: Patient Education method: Explanation, Demonstration, and Handouts Education comprehension: verbalized understanding, returned demonstration, and needs further education   HOME EXERCISE PROGRAM: Access Code: [email or web address] URL: [email or web address] Date: 05/21/2022 Prepared by: Estill Bamberg April Thurnell Garbe  Exercises - Standing Cervical Retraction  - 1 x daily - 7 x weekly - 2 sets - 10 reps - 3 sec hold - Seated Scapular Retraction  - 1 x daily - 7 x weekly - 2 sets - 10 reps - 3 sec hold - Seated Levator Scapulae Stretch  - 1 x daily - 7 x weekly - 30 sec hold - Seated Gentle Upper Trapezius Stretch  - 1 x daily - 7 x weekly - 30 sec hold - Doorway Pec Stretch at 60 Elevation  - 1 x daily - 7 x weekly - 3 sets - 10 reps - Doorway Pec Stretch at 90 Degrees Abduction  - 1 x daily - 7 x weekly - 3 sets - 10 reps - Doorway Pec Stretch at 120 Degrees Abduction  - 1 x daily - 7 x weekly - 3 sets - 10 reps  ASSESSMENT:  CLINICAL IMPRESSION: Treatment session focused primarily on decreasing pt's cervical and thoracic muscle tension. Performed trial of TPDN -- will assess response to decide if further dry needling  is appropriate. Attempted for pt to perform self suboccipital  release/stretch; however, pt unable to produce enough force to obtain adequate relief of tension.    OBJECTIVE IMPAIRMENTS decreased strength, increased fascial restrictions, increased muscle spasms, impaired UE functional use, improper body mechanics, and postural dysfunction.   ACTIVITY LIMITATIONS sitting  PARTICIPATION LIMITATIONS: occupation  PERSONAL FACTORS Fitness, Past/current experiences, Profession, and Time since onset of injury/illness/exacerbation are also affecting patient's functional outcome.   REHAB POTENTIAL: Good  CLINICAL DECISION MAKING: Evolving/moderate complexity  EVALUATION COMPLEXITY: Moderate   GOALS: Goals reviewed with patient? Yes   LONG TERM GOALS: Target date: 07/02/2022  Pt will be ind with HEP and body mechanics to maintain postural stability Baseline: newly provided Goal status: INITIAL  2.  Pt will demo improved posterior shoulder strength to at least 4+/5 for improved postural stability Baseline: grossly 3+ to 4-/5 Goal status: INITIAL  3.  Pt will report improved neck tightness/pain by >/=50% Baseline:  Goal status: INITIAL  4.  Pt will report reduced frequency of cervicogenic headaches/migraines by >/=25% Baseline: Chronic migraines Goal status: INITIAL    PLAN: PT FREQUENCY: 1x/week  PT DURATION: 6 weeks  PLANNED INTERVENTIONS: Therapeutic exercises, Therapeutic activity, Neuromuscular re-education, Balance training, Gait training, Patient/Family education, Self Care, Joint mobilization, Dry Needling, Electrical stimulation, Spinal mobilization, Cryotherapy, Moist heat, Taping, Traction, Ionotophoresis '4mg'$ /ml Dexamethasone, Manual therapy, and Re-evaluation  PLAN FOR NEXT SESSION: Assess response to HEP. Provide education/handout on cervical body mechanics and desk ergonomics. Manual work/TPDN as indicated for cervical/shoulder muscles. Work on Arts administrator.    Lambert Jeanty April Gordy Levan, PT, DPT 06/05/2022, 5:16 PM

## 2022-06-12 ENCOUNTER — Ambulatory Visit: Payer: No Typology Code available for payment source | Admitting: Physical Therapy

## 2022-06-12 ENCOUNTER — Encounter: Payer: Self-pay | Admitting: Physical Therapy

## 2022-06-12 DIAGNOSIS — M542 Cervicalgia: Secondary | ICD-10-CM

## 2022-06-12 DIAGNOSIS — R293 Abnormal posture: Secondary | ICD-10-CM

## 2022-06-12 DIAGNOSIS — M5481 Occipital neuralgia: Secondary | ICD-10-CM | POA: Diagnosis not present

## 2022-06-12 DIAGNOSIS — M5411 Radiculopathy, occipito-atlanto-axial region: Secondary | ICD-10-CM

## 2022-06-12 NOTE — Therapy (Addendum)
OUTPATIENT PHYSICAL THERAPY TREATMENT   Patient Name: Deborah Franklin MRN: [id number] DOB:1975/10/28, 46 y.o., female Today's Date: 06/12/2022  PHYSICAL THERAPY DISCHARGE SUMMARY  Visits from Start of Care: 3  Current functional level related to goals / functional outcomes: See below   Remaining deficits: See below   Education / Equipment: See below   Patient agrees to discharge. Patient goals were partially met. Patient is being discharged due to not returning since the last visit.    PT End of Session - 06/12/22 1619     Visit Number 3    Number of Visits 6    Date for PT Re-Evaluation 07/02/22    PT Start Time 1619    PT Stop Time 1700    PT Time Calculation (min) 41 min    Activity Tolerance Patient tolerated treatment well    Behavior During Therapy WFL for tasks assessed/performed              Past Medical History:  Diagnosis Date   Breast calcifications    left   Ear pain, right    Endometriosis    Headache    menstrual migraines   Uterine fibroid    Past Surgical History:  Procedure Laterality Date   BREAST SURGERY Bilateral 2013   enhancement    HERNIA REPAIR Right    [facility]   RADIOACTIVE SEED GUIDED EXCISIONAL BREAST BIOPSY Left 04/08/2018   Procedure: RADIOACTIVE SEED GUIDED EXCISIONAL BREAST BIOPSY;  Surgeon: Rolm Bookbinder, MD;  Location: [place];  Service: General;  Laterality: Left;   There are no problems to display for this patient.   PCP: Orpah Melter  REFERRING PROVIDER: Genia Harold  REFERRING DIAG: Occipital neuralgia  THERAPY DIAG:  Abnormal posture  Cervicalgia  Radiculopathy, occipito-atlanto-axial region  Rationale for Evaluation and Treatment Rehabilitation  ONSET DATE: This past year  SUBJECTIVE:                                                                                                                                                                                                          SUBJECTIVE STATEMENT: Pt reports soreness for the first 24 hours. After 24 hours she felt good relief in the points that were needled.    PERTINENT HISTORY:  Long history of migraines  PAIN:  Are you having pain? Yes: NPRS scale: 10 at worst when it occurs/10 Pain location: Behind ear to head Pain description: Sharp/shocking pain that goes away Aggravating factors: not known Relieving factors: not known  PRECAUTIONS: None  WEIGHT BEARING RESTRICTIONS No  FALLS:  Has patient fallen in last 6 months? No  LIVING ENVIRONMENT: Lives with: lives with their family Lives in: House/apartment Stairs: No Has following equipment at home: None  OCCUPATION: Professor; mostly at desk  PLOF: Independent  PATIENT GOALS Trial of PT for conservative measures to address her occipital neuralgia  OBJECTIVE:   PATIENT SURVEYS:  FOTO n/a -- no issues currently   COGNITION: Overall cognitive status: Within functional limits for tasks assessed   SENSATION: WFL  POSTURE: rounded shoulders and forward head  PALPATION: TTP with multiple trigger points in suboccipitals, cervical to thoracic paraspinals, posterior scalenes, levators, UTs   CERVICAL ROM:   Active ROM A/PROM (deg) eval  Flexion 38  Extension 65  Right lateral flexion 38  Left lateral flexion 38  Right rotation 80  Left rotation 80   (Blank rows = not tested)  UPPER EXTREMITY ROM: WFL  UPPER EXTREMITY MMT:  MMT Right eval Left eval  Shoulder flexion 5 5  Shoulder extension 4+ 4+  Shoulder abduction 5 5  Shoulder adduction    Shoulder extension 4- 4-  Shoulder internal rotation 4- 4-  Shoulder external rotation 4- 4-  Middle trapezius    Lower trapezius    Elbow flexion    Elbow extension    Wrist flexion    Wrist extension    Wrist ulnar deviation    Wrist radial deviation    Wrist pronation    Wrist supination    Grip strength     (Blank rows = not tested)  CERVICAL SPECIAL TESTS:  Did not  assess   TODAY'S TREATMENT:   06/12/22 THEREX UBE L1 x 2 min fwd/2 min bwd Doorway pec stretch 3 positions x30 sec each Self suboccipital release with pool noodle vs tennis balls Standing shoulder ER red TB x10 "W" red TB x10 Shoulder horizontal abd red TB x10   MANUAL THERAPY STM & TPR upper trap, thoracic paraspinals, cervical paraspinals, suboccipitals, posterior scalenes Skilled assessment and palpation for TPDN Trigger Point Dry-Needling  Treatment instructions: Expect mild to moderate muscle soreness. S/S of pneumothorax if dry needled over a lung field, and to seek immediate medical attention should they occur. Patient verbalized understanding of these instructions and education.  Patient Consent Given: Yes Education handout provided: Yes Muscles treated: Suboccipitals, posterior scalenes, UTs, thoracic paraspinals Electrical stimulation performed: No Parameters: N/A Treatment response/outcome: Palpable increase in muscle length, twitch response illicited    95/28/41 THEREX UBE L1 x 4 min Neck flexion x30 sec Upper trap stretch x30 sec R&L Trial of suboccipital stretching in sitting and supine -- pt unable to provide enough force to obtain adequate stretch  MANUAL THERAPY STM & TPR upper trap, thoracic paraspinals, cervical paraspinals, suboccipitals, posterior scalenes Skilled assessment and palpation for TPDN Trigger Point Dry-Needling  Treatment instructions: Expect mild to moderate muscle soreness. S/S of pneumothorax if dry needled over a lung field, and to seek immediate medical attention should they occur. Patient verbalized understanding of these instructions and education.  Patient Consent Given: Yes Education handout provided: Yes Muscles treated: Suboccipitals, posterior scalenes Electrical stimulation performed: No Parameters: N/A Treatment response/outcome: Palpable increase in muscle length, twitch response illicited  MODALITIES Moist heat x8  min   PATIENT EDUCATION:  Education details: HEP updates, needling as needed Person educated: Patient Education method: Explanation, Demonstration, and Handouts Education comprehension: verbalized understanding, returned demonstration, and needs further education   HOME EXERCISE PROGRAM: Access Code: [email or web address] URL: [email or web address] Date: 05/21/2022 Prepared by: Estill Bamberg April Lelan Pons  Doretta Remmert  Exercises - Standing Cervical Retraction  - 1 x daily - 7 x weekly - 2 sets - 10 reps - 3 sec hold - Seated Scapular Retraction  - 1 x daily - 7 x weekly - 2 sets - 10 reps - 3 sec hold - Seated Levator Scapulae Stretch  - 1 x daily - 7 x weekly - 30 sec hold - Seated Gentle Upper Trapezius Stretch  - 1 x daily - 7 x weekly - 30 sec hold - Doorway Pec Stretch at 60 Elevation  - 1 x daily - 7 x weekly - 3 sets - 10 reps - Doorway Pec Stretch at 90 Degrees Abduction  - 1 x daily - 7 x weekly - 3 sets - 10 reps - Doorway Pec Stretch at 120 Degrees Abduction  - 1 x daily - 7 x weekly - 3 sets - 10 reps  ASSESSMENT:  CLINICAL IMPRESSION: Pt with good response to TPDN. Pt notes less headaches and less point tenderness to suboccipitals. Session focused on continuing to provide education on self trigger point release/stretching of suboccipitals. Initiated postural stabilization exercises/strengthening.    OBJECTIVE IMPAIRMENTS decreased strength, increased fascial restrictions, increased muscle spasms, impaired UE functional use, improper body mechanics, and postural dysfunction.   ACTIVITY LIMITATIONS sitting  PARTICIPATION LIMITATIONS: occupation  PERSONAL FACTORS Fitness, Past/current experiences, Profession, and Time since onset of injury/illness/exacerbation are also affecting patient's functional outcome.   REHAB POTENTIAL: Good  CLINICAL DECISION MAKING: Evolving/moderate complexity  EVALUATION COMPLEXITY: Moderate   GOALS: Goals reviewed with patient? Yes   LONG  TERM GOALS: Target date: 07/02/2022  Pt will be ind with HEP and body mechanics to maintain postural stability Baseline: newly provided Goal status: IN PROGRESS  2.  Pt will demo improved posterior shoulder strength to at least 4+/5 for improved postural stability Baseline: grossly 3+ to 4-/5 Goal status: IN PROGRESS  3.  Pt will report improved neck tightness/pain by >/=50% Baseline:  Goal status: IN PROGRESS  4.  Pt will report reduced frequency of cervicogenic headaches/migraines by >/=25% Baseline: Chronic migraines Goal status: IN PROGRESS    PLAN: PT FREQUENCY: 1x/week  PT DURATION: 6 weeks  PLANNED INTERVENTIONS: Therapeutic exercises, Therapeutic activity, Neuromuscular re-education, Balance training, Gait training, Patient/Family education, Self Care, Joint mobilization, Dry Needling, Electrical stimulation, Spinal mobilization, Cryotherapy, Moist heat, Taping, Traction, Ionotophoresis 93m/ml Dexamethasone, Manual therapy, and Re-evaluation  PLAN FOR NEXT SESSION: Assess response to HEP. Provide education/handout on cervical body mechanics and desk ergonomics. Manual work/TPDN as indicated for cervical/shoulder muscles. Work on pNurse, adult    Avett Reineck April MGordy Levan PT, DPT 06/12/2022, 5:13 PM

## 2022-12-09 ENCOUNTER — Ambulatory Visit: Payer: No Typology Code available for payment source | Admitting: Psychiatry

## 2023-01-16 ENCOUNTER — Other Ambulatory Visit: Payer: Self-pay | Admitting: Surgery

## 2023-01-28 ENCOUNTER — Encounter: Payer: Self-pay | Admitting: Surgery

## 2023-02-09 ENCOUNTER — Other Ambulatory Visit: Payer: Self-pay | Admitting: Surgery

## 2023-02-09 DIAGNOSIS — R1031 Right lower quadrant pain: Secondary | ICD-10-CM

## 2023-02-13 ENCOUNTER — Ambulatory Visit
Admission: RE | Admit: 2023-02-13 | Discharge: 2023-02-13 | Disposition: A | Discharge status: Home / Self Care | Payer: No Typology Code available for payment source | Source: Ambulatory Visit | Attending: Surgery | Admitting: Surgery

## 2023-02-13 DIAGNOSIS — R1031 Right lower quadrant pain: Secondary | ICD-10-CM

## 2023-02-13 MED ORDER — IOPAMIDOL (ISOVUE-300) INJECTION 61%
100.0000 mL | Freq: Once | INTRAVENOUS | Status: AC | PRN
  Administered 2023-02-13: 100 mL via INTRAVENOUS

## 2023-03-09 ENCOUNTER — Encounter: Payer: Self-pay | Admitting: Psychiatry

## 2023-03-09 ENCOUNTER — Telehealth: Payer: Self-pay | Admitting: Psychiatry

## 2023-03-09 NOTE — Telephone Encounter (Signed)
LVM and sent letter in mail informing pt of need to reschedule 06/03/23 appt - MD leaving practice   If pt calls back to reschedule, ok to schedule with NP or can schedule with another provider who sees for symptoms pt was diagnosed with

## 2023-06-03 ENCOUNTER — Ambulatory Visit: Payer: No Typology Code available for payment source | Admitting: Psychiatry

## 2023-09-10 ENCOUNTER — Telehealth: Payer: Self-pay

## 2023-09-10 ENCOUNTER — Other Ambulatory Visit: Payer: Self-pay | Admitting: Neurology

## 2023-09-10 ENCOUNTER — Encounter: Payer: Self-pay | Admitting: Neurology

## 2023-09-10 MED ORDER — GABAPENTIN 300 MG PO CAPS: 300.0000 mg | ORAL_CAPSULE | Freq: Two times a day (BID) | ORAL | 0 refills | Status: AC | PRN

## 2023-09-10 NOTE — Telephone Encounter (Signed)
Call to patient who reports for the last 2 days the sharp, electric pain in her ears and brain on the left side. It is consistent with her previous migraine symptoms. She denies stroke like symptoms and says she feels weak due to not sleeping as well the last few nights due to pain. She states that Dr. Delena Bali had told her last visit to call when these symptoms were present to be evaluated. I advised Dr. Teresa Coombs doesn't have an opening but I will send to him to review. But if symptoms changed or did not improve to go to the ER. She is currently only taking ibuprofen. She verbalized understanding.

## 2023-09-10 NOTE — Telephone Encounter (Signed)
Call to patient and advised Dr. Teresa Coombs sent in gabapentin and reviewed dosing and administration. Advised it can cause drowsiness so be mindful with driving. Pharmacy updated and patient will call and have transferred. She verbalized understanding

## 2023-09-10 NOTE — Telephone Encounter (Signed)
My schedule is full, I do not have any opening. I can give her Gabapentin as suggested by Chima to try until our appointment next week. Gabapentin once to twice a day as needed for pain. l

## 2023-09-17 ENCOUNTER — Encounter: Payer: Self-pay | Admitting: Neurology

## 2023-09-17 ENCOUNTER — Ambulatory Visit: Payer: No Typology Code available for payment source | Admitting: Neurology

## 2023-09-17 VITALS — BP 104/68 | HR 60 | Ht 67.0 in | Wt 117.0 lb

## 2023-09-17 DIAGNOSIS — R519 Headache, unspecified: Secondary | ICD-10-CM

## 2023-09-17 DIAGNOSIS — M5481 Occipital neuralgia: Secondary | ICD-10-CM | POA: Diagnosis not present

## 2023-09-17 MED ORDER — INDOMETHACIN 25 MG PO CAPS: ORAL_CAPSULE | ORAL | 0 refills | Status: DC

## 2023-09-17 NOTE — Progress Notes (Signed)
Referring:  Joycelyn Rua, MD [address] [place],  [place] [address]  PCP: Joycelyn Rua, MD  Neurology was asked to evaluate Deborah Franklin, a 48 year old female for a chief complaint of headaches.  Our recommendations of care will be communicated by shared medical record.    CC:  headaches  History provided from self  INTERVAL HISTORY 09/17/2023 Patient presents today for follow-up, last visit was in July.  At that time, she was recommended physical therapy for occipital neuralgia.  She has completed therapy.  In term of her headaches, she tells me that she will have a 2 weeks period of sharp stabbing electrical shock starting in the back of her head, on the left side lasting a couple seconds.  She can have multiple episodes in a day for about 2 weeks then the pain will resolve for about a month or 2.  About 2 weeks ago she did have reoccurrence of the pain, she contacted the office, we have started her on gabapentin.  She tells me that gabapentin decrease it but did not take the pain away, she is still experiencing it but less intense.  She has a history of migraine and states this pain is totally different from the migraine.  No other associated symptoms including nausea, no vomiting, no sensitivity to light or noise.    HPI:  Medical co-morbidities: migraines  The patient presents for evaluation of headaches which began in last year. This has occurred 3-4 times in the past year. Headaches are described as sharp, shocking pain behind her ear which radiates up her scalp. It can occur on either side. Headaches are not associated with photophobia, phonophobia, or nausea. Pain will last for 2-3 seconds at a time, but can reoccur multiple times per day. She also had tenderness on the back of her head. Her most recent headache was in July. Headaches lasted for 3-4 weeks, then resolved.  Her PCP prescribed prednisone, which helped slightly but did not fully relieve the pain. She was also  prescribed Lyrica 50 mg BID but did not take it as her pain resolved.  She also reports intermittent numbness in her left pinky. This typically lasts ~20 minutes at a time. Denies positional aspect. No radicular pain or upper extremity weakness.   Headache History: Onset: August 2023 Triggers: none Aura: none Location: behind the ear up the scalp Quality/Description: electrical shock Associated Symptoms:  Photophobia: none  Phonophobia: none  Nausea: none Worse with activity?: no Duration of headaches: 2-3 seconds, multiple times per day  Headache days per month: 0 Headache free days per month: 30  Current Treatment: Abortive none  Preventative none  Prior Therapies                                 Imitrex 25 mg PRN Flexeril 10 mg PRN prednisone  LABS: 08/2019 CBC, CMP wnl  IMAGING:  none  Current Outpatient Medications on File Prior to Visit  Medication Sig Dispense Refill   gabapentin (NEURONTIN) 300 MG capsule Take 1 capsule (300 mg total) by mouth 2 (two) times daily as needed (for pain). 60 capsule 0   Mefenamic Acid 250 MG CAPS Take by mouth.     SUMAtriptan (IMITREX) 100 MG tablet Take 100 mg by mouth every 2 (two) hours as needed for migraine. May repeat in 2 hours if headache persists or recurs.     No current  facility-administered medications on file prior to visit.     Allergies: Allergies  Allergen Reactions   Aspirin Other (See Comments)    GI upset   Caffeine Other (See Comments)    Gi upset     Family History: Migraine or other headaches in the family:  mother, son Aneurysms in a first degree relative:  no Brain tumors in the family:  no Other neurological illness in the family:   no  Past Medical History: Past Medical History:  Diagnosis Date   Breast calcifications    left   Ear pain, right    Endometriosis    Headache    menstrual migraines   Uterine fibroid     Past Surgical History Past Surgical History:  Procedure  Laterality Date   BREAST SURGERY Bilateral 2013   enhancement    HERNIA REPAIR Right    [facility]   RADIOACTIVE SEED GUIDED EXCISIONAL BREAST BIOPSY Left 04/08/2018   Procedure: RADIOACTIVE SEED GUIDED EXCISIONAL BREAST BIOPSY;  Surgeon: Emelia Loron, MD;  Location: [facility];  Service: General;  Laterality: Left;    Social History: Social History   Tobacco Use   Smoking status: Never   Smokeless tobacco: Never  Substance Use Topics   Alcohol use: No   Drug use: Never    ROS: Negative for fevers, chills. Positive for headaches. All other systems reviewed and negative unless stated otherwise in HPI.   Physical Exam:   Vital Signs: BP 104/68 (Cuff Size: Normal)   Pulse 60   Ht 5\' 7"  (1.702 m)   Wt 117 lb (53.1 kg)   BMI 18.32 kg/m  GENERAL: well appearing,in no acute distress,alert SKIN:  Color, texture, turgor normal. No rashes or lesions HEAD:  Normocephalic/atraumatic. CV:  RRR RESP: Normal respiratory effort MSK: +tenderness to palpation over bilateral occiput  NEUROLOGICAL: Mental Status: Alert, oriented to person, place and time,Follows commands Cranial Nerves: PERRL, visual fields intact to confrontation, extraocular movements intact, facial sensation intact, no facial droop or ptosis, hearing grossly intact, no dysarthria Motor: muscle strength 5/5 both upper and lower extremities Reflexes: 2+ throughout Sensation: intact to light touch all 4 extremities Coordination: Finger-to- nose-finger intact bilaterally Gait: normal-based   IMPRESSION: 48 year old female with a history of migraines who presents for evaluation of occipital headaches. Her exam reveals tenderness over bilateral occipital notches, suggestive of occipital neuralgia. We would obtain MRI Brain and MRI cervical spine and refer the patient for Occipital nerve block.    Patient Instructions  MRI Brain with and without contrast  MRI Cervical spine with and without contrast   Referral to Dr. Santiago Glad at the [facility] for Occipital nerve block  Return in 3 months or sooner if worse    I have spent a total of 50 minutes dedicated to this patient today, preparing to see patient, performing a medically appropriate examination and evaluation, ordering tests and/or medications and procedures, and counseling and educating the patient/family/caregiver; independently interpreting result and communicating results to the family/patient/caregiver; and documenting clinical information in the electronic medical record.   Windell Norfolk, MD 09/17/2023   4:49 PM

## 2023-09-17 NOTE — Patient Instructions (Signed)
MRI Brain with and without contrast  MRI Cervical spine with and without contrast  Referral to Dr. Santiago Glad at the [facility] for Occipital nerve block  Return in 3 months or sooner if worse

## 2023-09-21 ENCOUNTER — Telehealth: Payer: Self-pay | Admitting: Neurology

## 2023-09-21 NOTE — Telephone Encounter (Signed)
Referral  for neurology fax to [facility] to see Dr. Santiago Glad. Phone: [phone], Fax: [phone]

## 2023-09-22 ENCOUNTER — Telehealth: Payer: Self-pay | Admitting: Neurology

## 2023-09-22 NOTE — Telephone Encounter (Signed)
sent to GI they obtain [employer] auth [id number] 

## 2023-10-16 ENCOUNTER — Encounter: Payer: Self-pay | Admitting: Neurology

## 2023-10-30 ENCOUNTER — Encounter: Payer: Self-pay | Admitting: Neurology

## 2023-10-30 ENCOUNTER — Ambulatory Visit
Admission: RE | Admit: 2023-10-30 | Discharge: 2023-10-30 | Disposition: A | Discharge status: Home / Self Care | Payer: No Typology Code available for payment source | Source: Ambulatory Visit | Attending: Neurology | Admitting: Neurology

## 2023-10-30 DIAGNOSIS — R519 Headache, unspecified: Secondary | ICD-10-CM

## 2023-10-30 DIAGNOSIS — M5481 Occipital neuralgia: Secondary | ICD-10-CM

## 2023-10-30 MED ORDER — GADOPICLENOL 0.5 MMOL/ML IV SOLN
5.0000 mL | Freq: Once | INTRAVENOUS | Status: AC | PRN
  Administered 2023-10-30: 5 mL via INTRAVENOUS

## 2023-12-16 ENCOUNTER — Ambulatory Visit: Payer: No Typology Code available for payment source | Admitting: Neurology

## 2024-07-04 ENCOUNTER — Ambulatory Visit: Admitting: Neurology

## 2025-01-18 ENCOUNTER — Ambulatory Visit: Admitting: Neurology

## 2025-01-31 ENCOUNTER — Ambulatory Visit: Admitting: Neurology
